# Patient Record
Sex: Male | Born: 1970 | Race: White | Hispanic: No | Marital: Married | Smoking: Current every day smoker
Health system: Southern US, Community
[De-identification: ages and names within clinical notes are randomized; demographics above are authoritative.]

## PROBLEM LIST (undated history)

## (undated) DIAGNOSIS — E785 Hyperlipidemia, unspecified: Secondary | ICD-10-CM

## (undated) DIAGNOSIS — I10 Essential (primary) hypertension: Secondary | ICD-10-CM

## (undated) HISTORY — PX: COLONOSCOPY: SHX174

## (undated) HISTORY — PX: TONSILLECTOMY: SUR1361

---

## 1999-06-02 ENCOUNTER — Emergency Department (HOSPITAL_COMMUNITY): Admission: EM | Admit: 1999-06-02 | Discharge: 1999-06-02 | Payer: Self-pay | Admitting: Emergency Medicine

## 2016-10-20 ENCOUNTER — Encounter (HOSPITAL_COMMUNITY): Payer: Self-pay | Admitting: Oncology

## 2016-10-20 ENCOUNTER — Emergency Department (HOSPITAL_COMMUNITY)
Admission: EM | Admit: 2016-10-20 | Discharge: 2016-10-20 | Disposition: A | Discharge status: Home / Self Care | Payer: BLUE CROSS/BLUE SHIELD | Attending: Emergency Medicine | Admitting: Emergency Medicine

## 2016-10-20 ENCOUNTER — Emergency Department (HOSPITAL_COMMUNITY): Payer: BLUE CROSS/BLUE SHIELD

## 2016-10-20 DIAGNOSIS — R079 Chest pain, unspecified: Secondary | ICD-10-CM

## 2016-10-20 DIAGNOSIS — I10 Essential (primary) hypertension: Secondary | ICD-10-CM | POA: Insufficient documentation

## 2016-10-20 DIAGNOSIS — F17210 Nicotine dependence, cigarettes, uncomplicated: Secondary | ICD-10-CM | POA: Insufficient documentation

## 2016-10-20 DIAGNOSIS — R0602 Shortness of breath: Secondary | ICD-10-CM | POA: Insufficient documentation

## 2016-10-20 HISTORY — DX: Hyperlipidemia, unspecified: E78.5

## 2016-10-20 HISTORY — DX: Essential (primary) hypertension: I10

## 2016-10-20 LAB — CBC WITH DIFFERENTIAL/PLATELET
BASOS ABS: 0 10*3/uL (ref 0.0–0.1)
BASOS PCT: 0 %
EOS ABS: 0.2 10*3/uL (ref 0.0–0.7)
Eosinophils Relative: 3 %
HEMATOCRIT: 42.2 % (ref 39.0–52.0)
Hemoglobin: 14 g/dL (ref 13.0–17.0)
Lymphocytes Relative: 36 %
Lymphs Abs: 2.9 10*3/uL (ref 0.7–4.0)
MCH: 30.5 pg (ref 26.0–34.0)
MCHC: 33.2 g/dL (ref 30.0–36.0)
MCV: 91.9 fL (ref 78.0–100.0)
MONO ABS: 0.7 10*3/uL (ref 0.1–1.0)
MONOS PCT: 9 %
NEUTROS ABS: 4.1 10*3/uL (ref 1.7–7.7)
Neutrophils Relative %: 52 %
PLATELETS: 165 10*3/uL (ref 150–400)
RBC: 4.59 MIL/uL (ref 4.22–5.81)
RDW: 12.1 % (ref 11.5–15.5)
WBC: 7.9 10*3/uL (ref 4.0–10.5)

## 2016-10-20 LAB — BASIC METABOLIC PANEL
ANION GAP: 8 (ref 5–15)
BUN: 11 mg/dL (ref 6–20)
CALCIUM: 9.3 mg/dL (ref 8.9–10.3)
CO2: 24 mmol/L (ref 22–32)
CREATININE: 0.71 mg/dL (ref 0.61–1.24)
Chloride: 103 mmol/L (ref 101–111)
Glucose, Bld: 111 mg/dL — ABNORMAL HIGH (ref 65–99)
Potassium: 4.2 mmol/L (ref 3.5–5.1)
Sodium: 135 mmol/L (ref 135–145)

## 2016-10-20 LAB — I-STAT TROPONIN, ED
TROPONIN I, POC: 0 ng/mL (ref 0.00–0.08)
Troponin i, poc: 0 ng/mL (ref 0.00–0.08)

## 2016-10-20 LAB — BRAIN NATRIURETIC PEPTIDE: B NATRIURETIC PEPTIDE 5: 10.8 pg/mL (ref 0.0–100.0)

## 2016-10-20 LAB — D-DIMER, QUANTITATIVE (NOT AT ARMC)

## 2016-10-20 MED ORDER — AMLODIPINE BESYLATE 5 MG PO TABS: 5.0000 mg | ORAL_TABLET | Freq: Once | ORAL | Status: DC

## 2016-10-20 MED ORDER — AMLODIPINE BESYLATE 2.5 MG PO TABS: 2.5000 mg | ORAL_TABLET | Freq: Every day | ORAL | 3 refills | Status: AC

## 2016-10-20 NOTE — ED Notes (Signed)
Patient transported to X-ray 

## 2016-10-20 NOTE — ED Notes (Signed)
Water given as requested

## 2016-10-20 NOTE — ED Provider Notes (Signed)
[facility]EMERGENCY DEPT Provider Note   CSN: [id number] Arrival date & time: 10/20/16  0024     History   Chief Complaint Chief Complaint  Patient presents with  . Shortness of Breath    HPI Billy Stephens is a 46 y.o. male.  Patient presents to the emergency department for evaluation of chest pain and shortness of breath.he had an episode of pain that felt like an electric shock in his chest and then became short of breath, dizzy and felt like he was going to pass out. He thinks he then started to have some anxiety and maybe a panic attack. ymptoms have significantly improved, however. Patient reports that he did check his blood pressure at home and it was very elevated. He has a history of elevated blood pressure, previously on lisinopril. He had side effects from the lisinopril, his doctor stopped it because his blood pressure was "borderline". At arrival to the ER, patient is improved.      Past Medical History:  Diagnosis Date  . Hyperlipidemia   . Hypertension     There are no active problems to display for this patient.   Past Surgical History:  Procedure Laterality Date  . COLONOSCOPY    . TONSILLECTOMY         Home Medications    Prior to Admission medications   Medication Sig Start Date End Date Taking? Authorizing Provider  omeprazole (PRILOSEC) 20 MG capsule Take 20 mg by mouth daily.   Yes [provider]  amLODipine (NORVASC) 2.5 MG tablet Take 1 tablet (2.5 mg total) by mouth daily. 10/20/16   Gilda Crease, MD    Family History No family history on file.  Social History Social History  Substance Use Topics  . Smoking status: Current Every Day Smoker    Packs/day: 0.50    Years: 25.00    Types: Cigarettes  . Smokeless tobacco: Never Used  . Alcohol use Yes     Comment: 8 beers per day     Allergies   Caffeine   Review of Systems Review of Systems  Respiratory: Positive for shortness of breath.   Cardiovascular:  Positive for chest pain.  All other systems reviewed and are negative.    Physical Exam Updated Vital Signs BP 136/84   Pulse 76   Temp 98.5 F (36.9 C) (Oral)   Resp 14   SpO2 96%   Physical Exam  Constitutional: He is oriented to person, place, and time. He appears well-developed and well-nourished. No distress.  HENT:  Head: Normocephalic and atraumatic.  Right Ear: Hearing normal.  Left Ear: Hearing normal.  Nose: Nose normal.  Mouth/Throat: Oropharynx is clear and moist and mucous membranes are normal.  Eyes: Pupils are equal, round, and reactive to light. Conjunctivae and EOM are normal.  Neck: Normal range of motion. Neck supple.  Cardiovascular: Regular rhythm, S1 normal and S2 normal.  Exam reveals no gallop and no friction rub.   No murmur heard. Pulmonary/Chest: Effort normal and breath sounds normal. No respiratory distress. He exhibits no tenderness.  Abdominal: Soft. Normal appearance and bowel sounds are normal. There is no hepatosplenomegaly. There is no tenderness. There is no rebound, no guarding, no tenderness at McBurney's point and negative Murphy's sign. No hernia.  Musculoskeletal: Normal range of motion.  Neurological: He is alert and oriented to person, place, and time. He has normal strength. No cranial nerve deficit or sensory deficit. Coordination normal. GCS eye subscore is 4. GCS verbal subscore is  5. GCS motor subscore is 6.  Skin: Skin is warm, dry and intact. No rash noted. No cyanosis.  Psychiatric: He has a normal mood and affect. His speech is normal and behavior is normal. Thought content normal.  Nursing note and vitals reviewed.    ED Treatments / Results  Labs (all labs ordered are listed, but only abnormal results are displayed) Labs Reviewed  BASIC METABOLIC PANEL - Abnormal; Notable for the following:       Result Value   Glucose, Bld 111 (*)    All other components within normal limits  CBC WITH DIFFERENTIAL/PLATELET  BRAIN  NATRIURETIC PEPTIDE  D-DIMER, QUANTITATIVE (NOT AT [facility])  I-STAT TROPONIN, ED  I-STAT TROPONIN, ED    EKG  EKG Interpretation  Date/Time:  Monday October 20 2016 00:32:49 EDT Ventricular Rate:  87 PR Interval:    QRS Duration: 84 QT Interval:  348 QTC Calculation: 419 R Axis:   75 Text Interpretation:  Sinus rhythm Probable left atrial enlargement Confirmed by Gilda CreasePollina, Happy J [phone] on 10/20/2016 12:42:04 AM       Radiology Dg Chest 2 View  Result Date: 10/20/2016 CLINICAL DATA:  Dyspnea for 2 hours.  Hypertension. EXAM: CHEST  2 VIEW COMPARISON:  None. FINDINGS: The lungs are clear. The pulmonary vasculature is normal. Heart size is normal. Hilar and mediastinal contours are unremarkable. There is no pleural effusion. IMPRESSION: No active cardiopulmonary disease. Electronically Signed   By: Ellery Plunkaniel R Mitchell M.D.   On: 10/20/2016 03:04    Procedures Procedures (including critical care time)  Medications Ordered in ED Medications  amLODipine (NORVASC) tablet 5 mg (5 mg Oral Not Given 10/20/16 0419)     Initial Impression / Assessment and Plan / ED Course  I have reviewed the triage vital signs and the nursing notes.  Pertinent labs & imaging results that were available during my care of the patient were reviewed by me and considered in my medical decision making (see chart for details).     Patient presents to the emergency department for evaluation of chest pain and shortness of breath. Patient had an episode earlier of sudden onset of shocklike pain in his chest followed by shortness of breath and some anxiety. He is much improved at the moment. His blood pressure was very high initially but has improved without intervention. I initially was going to administer amlodipine, but blood pressure came down to 146/88 without intervention.  EKG at arrival does not suggest ischemia or infarct. Troponin has been negative 2. He has a negative d-dimer. No evidence of  congestive heart failure, BNP normal, chest x-ray normal. Patient has been symptom free for a number of hours and has had a negative workup, is felt to be safe for discharge. Patient does not have a primary care physician at this time. Will be given a prescription for low-dose amlodipine. He will initiate this if he takes his blood pressure daily and it is staying high. He was counseled to find primary care for follow-up to further manage his blood pressure.  Final Clinical Impressions(s) / ED Diagnoses   Final diagnoses:  Chest pain, unspecified type  Essential hypertension    New Prescriptions New Prescriptions   AMLODIPINE (NORVASC) 2.5 MG TABLET    Take 1 tablet (2.5 mg total) by mouth daily.     Gilda CreasePollina, Guilherme J, MD 10/20/16 [phone]

## 2016-10-20 NOTE — ED Triage Notes (Signed)
Pt bib [facility] from home d/t central CP pt rated 1/10.  Pt reported to EMS that he had been drinking heavily yesterday and just sitting around the house.  Pt finished a rx for prednisone yesterday.  Per EMS upon arrival to pt's house, pt was diaphoretic, shob and pale.  Pt given 324 mg ASA PTA.  Pt denied radiation of CP.

## 2016-10-20 NOTE — ED Notes (Signed)
Pt reports having one episode of CP PTA that he described as a, "Shock."  Pt states, "It's like when you stuck your tongue on a 9 volt battery as a kid."  Pt states his main concern is the shob, dizziness and generally not feeling like himself that caused him to present to the hospital.

## 2017-05-13 ENCOUNTER — Encounter: Payer: Self-pay | Admitting: Gastroenterology

## 2017-05-18 ENCOUNTER — Other Ambulatory Visit: Payer: Self-pay

## 2017-05-18 ENCOUNTER — Ambulatory Visit: Payer: BLUE CROSS/BLUE SHIELD | Admitting: Gastroenterology

## 2018-07-30 ENCOUNTER — Encounter (HOSPITAL_COMMUNITY): Payer: Self-pay | Admitting: Emergency Medicine

## 2018-07-30 ENCOUNTER — Other Ambulatory Visit: Payer: Self-pay

## 2018-07-30 ENCOUNTER — Emergency Department (HOSPITAL_COMMUNITY)
Admission: EM | Admit: 2018-07-30 | Discharge: 2018-07-30 | Disposition: A | Discharge status: Home / Self Care | Payer: BLUE CROSS/BLUE SHIELD | Attending: Emergency Medicine | Admitting: Emergency Medicine

## 2018-07-30 DIAGNOSIS — T7840XA Allergy, unspecified, initial encounter: Secondary | ICD-10-CM | POA: Diagnosis not present

## 2018-07-30 DIAGNOSIS — I10 Essential (primary) hypertension: Secondary | ICD-10-CM | POA: Diagnosis not present

## 2018-07-30 DIAGNOSIS — F17210 Nicotine dependence, cigarettes, uncomplicated: Secondary | ICD-10-CM | POA: Diagnosis not present

## 2018-07-30 MED ORDER — EPINEPHRINE 0.3 MG/0.3ML IJ SOAJ: 0.3000 mg | INTRAMUSCULAR | 0 refills | Status: AC | PRN

## 2018-07-30 MED ORDER — PREDNISONE 20 MG PO TABS: 40.0000 mg | ORAL_TABLET | Freq: Every day | ORAL | 0 refills | Status: AC

## 2018-07-30 NOTE — ED Notes (Signed)
Bed: ID56 Expected date:  Expected time:  Means of arrival:  Comments: EMS 48 yo male allergic reaction bee sting/hives/swelling around site of sting

## 2018-07-30 NOTE — ED Triage Notes (Signed)
Patient BIB [facility] from home for bee sting at 1800 rt inner lower leg. Known allergy to bees. Within a few minutes pt noticed swelling, itching, and hives. Pt waited a little while before calling EMS due to fear of covid, but called when symptoms didn't improve. Pt hasn't taken any meds. Pt requested EMS not give Benadryl due to extreme drowsiness. Pt and EMS noted large firm,swollen area under left axillary, just below arm pit. Never noticed this before. LS clear, no difficulty breathing. No swelling of mouth, tongue or throat.

## 2018-07-31 NOTE — ED Provider Notes (Signed)
[facility] DEPT Provider Note   CSN: [id number] Arrival date & time: 07/30/18  1925     History   Chief Complaint Chief Complaint  Patient presents with  . Allergic Reaction    HPI Billy Stephens is a 48 y.o. male.     HPI   [name] with bee sting. Happened around 1800 today while mowing the lawn. Stung above R ankle. Went inside and then began having itching all over. Rash. Developed some painless swelling near L axilla and decided to seek evaluation. No respiratory complaints. No GI symptoms. No intervention prior to arrival.   Past Medical History:  Diagnosis Date  . Hyperlipidemia   . Hypertension     There are no active problems to display for this patient.   Past Surgical History:  Procedure Laterality Date  . COLONOSCOPY    . TONSILLECTOMY          Home Medications    Prior to Admission medications   Medication Sig Start Date End Date Taking? Authorizing Provider  amLODipine (NORVASC) 2.5 MG tablet Take 1 tablet (2.5 mg total) by mouth daily. Patient not taking: Reported on 07/30/2018 10/20/16   Orpah Greek, MD  EPINEPHrine (EPIPEN 2-PAK) 0.3 mg/0.3 mL IJ SOAJ injection Inject 0.3 mLs (0.3 mg total) into the muscle as needed for anaphylaxis. 07/30/18   Virgel Manifold, MD  predniSONE (DELTASONE) 20 MG tablet Take 2 tablets (40 mg total) by mouth daily. 07/30/18   Virgel Manifold, MD    Family History History reviewed. No pertinent family history.  Social History Social History   Tobacco Use  . Smoking status: Current Every Day Smoker    Packs/day: 0.50    Years: 25.00    Pack years: 12.50    Types: Cigarettes  . Smokeless tobacco: Never Used  Substance Use Topics  . Alcohol use: Yes    Comment: 8 beers per day  . Drug use: No     Allergies   Caffeine and Bee venom   Review of Systems Review of Systems  All systems reviewed and negative, other than as noted in HPI.  Physical Exam Updated Vital  Signs BP (!) 158/86 (BP Location: Right Arm)   Pulse (!) 101   Temp 99.4 F (37.4 C) (Oral)   Resp 18   Ht 6' (1.829 m)   Wt 111.1 kg   SpO2 98%   BMI 33.23 kg/m   Physical Exam Vitals signs and nursing note reviewed.  Constitutional:      General: He is not in acute distress.    Appearance: He is well-developed.  HENT:     Head: Normocephalic and atraumatic.     Mouth/Throat:     Mouth: Mucous membranes are moist.     Pharynx: Oropharynx is clear.     Comments: Oropharynx clear. Handling secretions. Normal sounding voice.  Eyes:     General:        Right eye: No discharge.        Left eye: No discharge.     Conjunctiva/sclera: Conjunctivae normal.  Neck:     Musculoskeletal: Neck supple.  Cardiovascular:     Rate and Rhythm: Normal rate and regular rhythm.     Heart sounds: Normal heart sounds. No murmur. No friction rub. No gallop.   Pulmonary:     Effort: Pulmonary effort is normal. No respiratory distress.     Breath sounds: Normal breath sounds.  Abdominal:     General: There is no  distension.     Palpations: Abdomen is soft.     Tenderness: There is no abdominal tenderness.  Musculoskeletal:        General: No tenderness.  Skin:    General: Skin is warm and dry.     Comments: Area to distal RLE consistent with recent insect bite/sting. Patch of erythema, increased warmth and induration. Painless swelling w/o overlying skin changes L axilla.   Neurological:     Mental Status: He is alert.  Psychiatric:        Behavior: Behavior normal.        Thought Content: Thought content normal.      ED Treatments / Results  Labs (all labs ordered are listed, but only abnormal results are displayed) Labs Reviewed - No data to display  EKG    Radiology No results found.  Procedures Procedures (including critical care time)  Medications Ordered in ED Medications - No data to display   Initial Impression / Assessment and Plan / ED Course  I have reviewed  the triage vital signs and the nursing notes.  Pertinent labs & imaging results that were available during my care of the patient were reviewed by me and considered in my medical decision making (see chart for details).     [name] with bee sting. Happened around 1800 today. Symptoms improving w/o intervention. No respiratory or GI complaints. No hypotension. Will discharge with prescriptions for epi pens. Discussed indications for usage. PRN benadryl recommended. Return precautions discussed.   Final Clinical Impressions(s) / ED Diagnoses   Final diagnoses:  Allergic reaction, initial encounter    ED Discharge Orders         Ordered    EPINEPHrine (EPIPEN 2-PAK) 0.3 mg/0.3 mL IJ SOAJ injection  As needed     07/30/18 2034    predniSONE (DELTASONE) 20 MG tablet  Daily     07/30/18 2043           Raeford RazorKohut, Kaija Kovacevic, MD 07/31/18 2004

## 2018-12-15 ENCOUNTER — Other Ambulatory Visit: Payer: Self-pay

## 2018-12-15 DIAGNOSIS — Z20822 Contact with and (suspected) exposure to covid-19: Secondary | ICD-10-CM

## 2018-12-16 LAB — NOVEL CORONAVIRUS, NAA: SARS-CoV-2, NAA: NOT DETECTED

## 2019-04-04 IMAGING — CR DG CHEST 2V
2 series · 2 of 2 positions shown · non-contrast
Comparison: None.

CLINICAL DATA: Dyspnea for 2 hours.  Hypertension.

EXAM:
CHEST  2 VIEW

[chest pa]
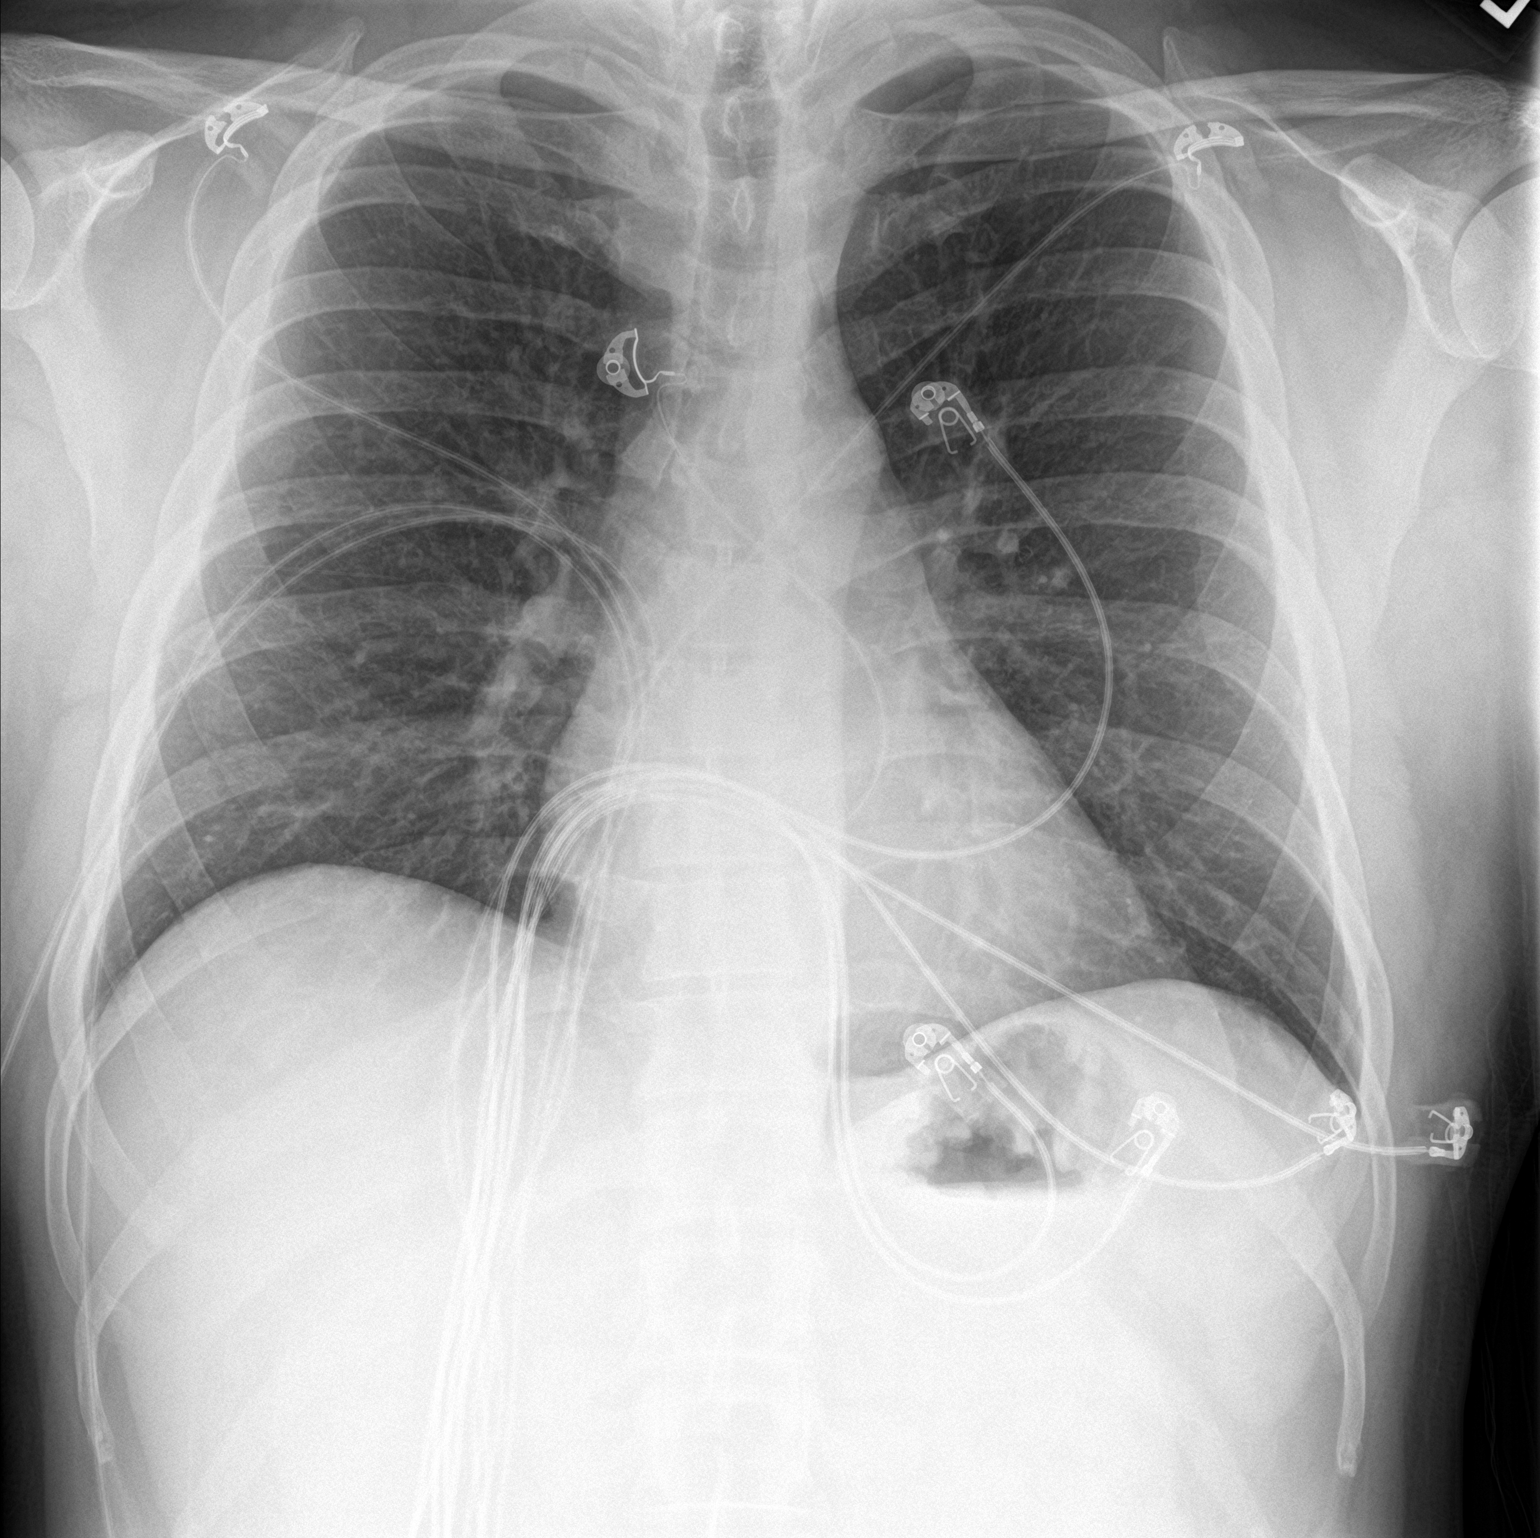

[chest lat]
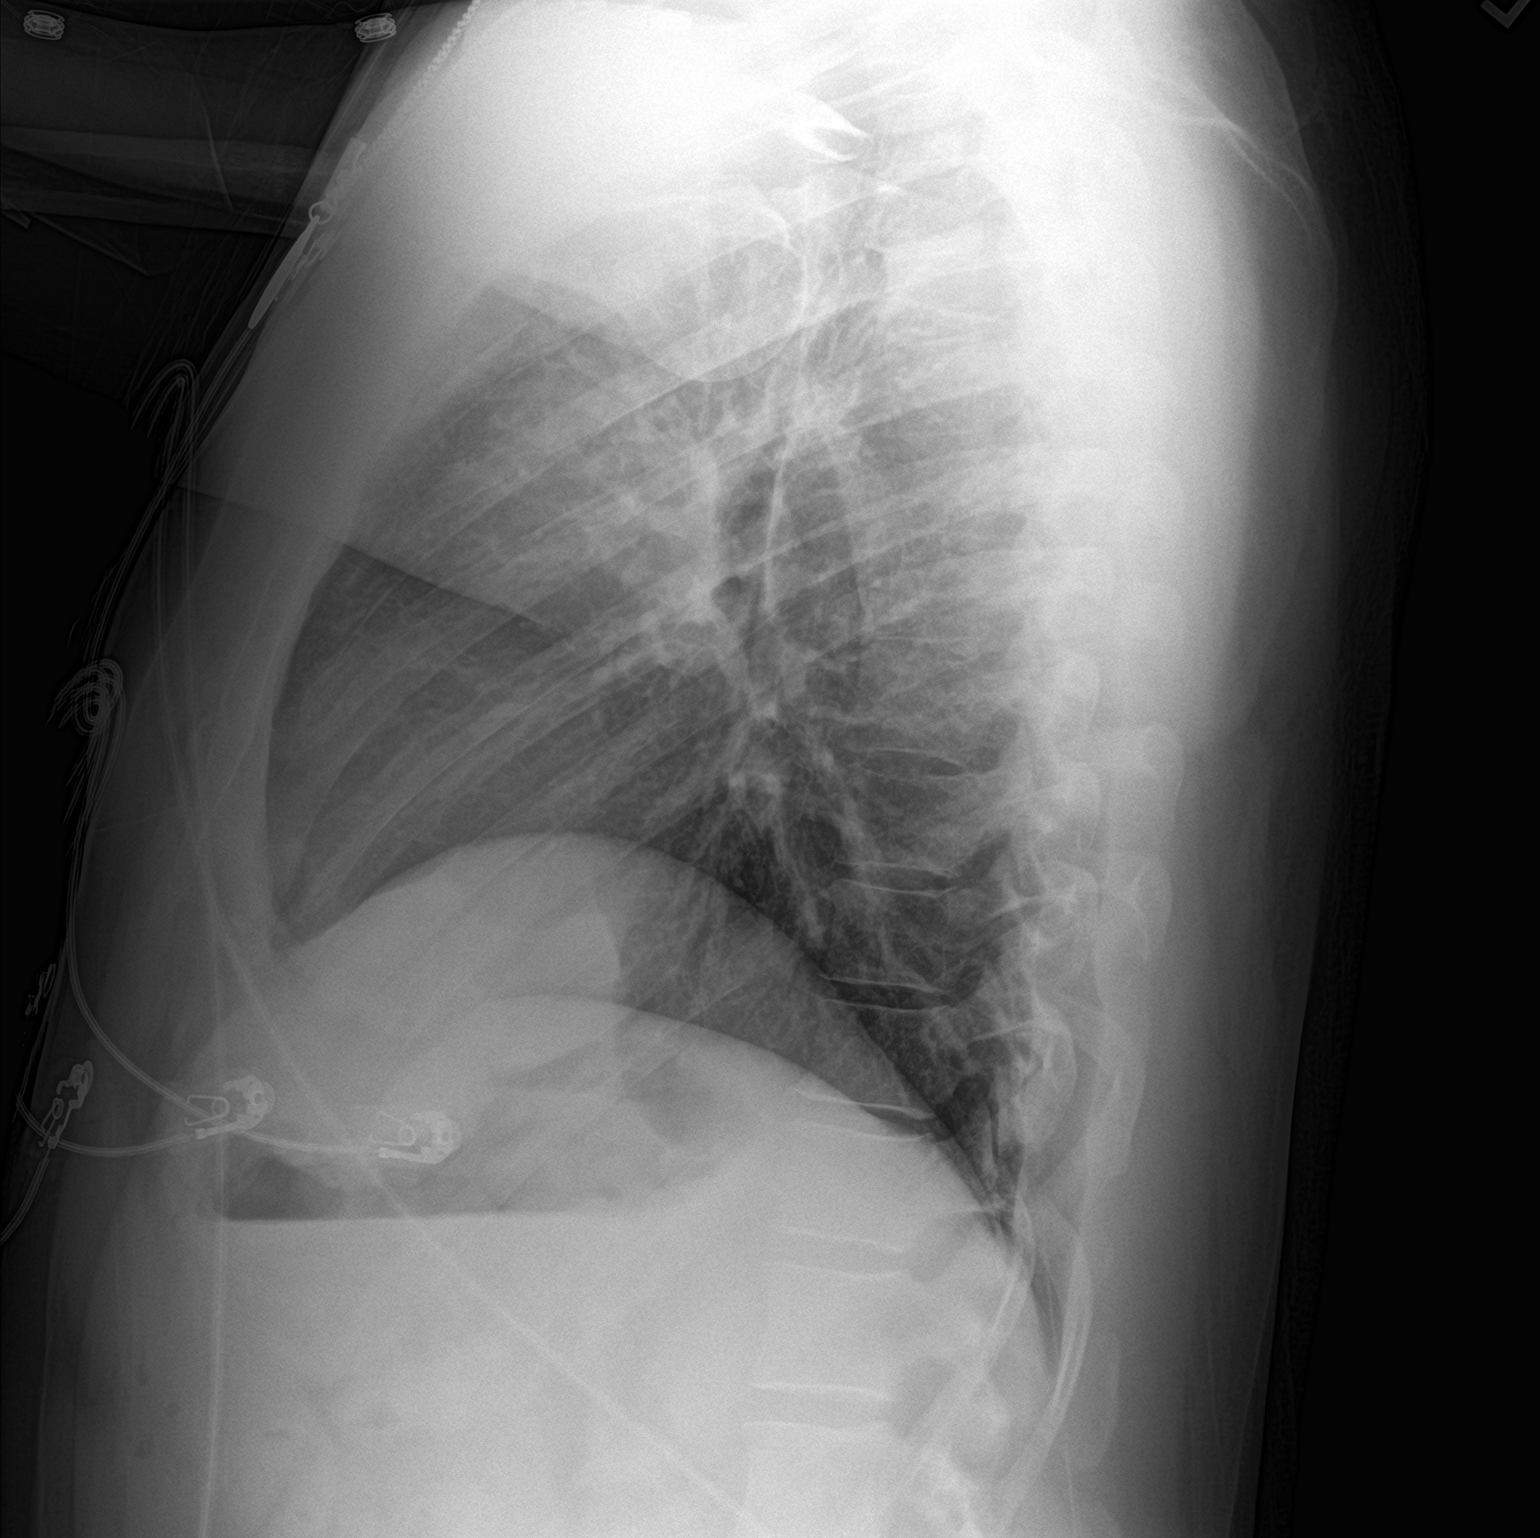

[2 of 2 positions shown; findings below may reference images not displayed]

FINDINGS: The lungs are clear. The pulmonary vasculature is normal. Heart size
is normal. Hilar and mediastinal contours are unremarkable. There is
no pleural effusion.
IMPRESSION: No active cardiopulmonary disease.

## 2019-04-23 ENCOUNTER — Ambulatory Visit: Payer: BLUE CROSS/BLUE SHIELD | Attending: Internal Medicine

## 2019-04-23 ENCOUNTER — Ambulatory Visit: Payer: BLUE CROSS/BLUE SHIELD

## 2019-04-23 DIAGNOSIS — Z23 Encounter for immunization: Secondary | ICD-10-CM | POA: Insufficient documentation

## 2019-04-23 NOTE — Progress Notes (Signed)
   Covid-19 Vaccination Clinic  Name:  Billy Stephens    MRN: [id number] DOB: 16-Dec-1970  04/23/2019  [name] was observed post Covid-19 immunization for 15 minutes without incident. He was provided with Vaccine Information Sheet and instruction to access the V-Safe system.   Mr. Due was instructed to call 911 with any severe reactions post vaccine: Marland Kitchen Difficulty breathing  . Swelling of face and throat  . A fast heartbeat  . A bad rash all over body  . Dizziness and weakness   Immunizations Administered    Name Date Dose VIS Date Route   Pfizer COVID-19 Vaccine 04/23/2019 12:28 PM 0.3 mL 01/28/2019 Intramuscular   Manufacturer: [employer], [employer]   Lot: [id number]   NDC: [address]-5

## 2019-05-17 ENCOUNTER — Ambulatory Visit: Payer: BLUE CROSS/BLUE SHIELD | Attending: Internal Medicine

## 2019-05-17 DIAGNOSIS — Z23 Encounter for immunization: Secondary | ICD-10-CM

## 2019-05-17 NOTE — Progress Notes (Signed)
   Covid-19 Vaccination Clinic  Name:  Billy Stephens    MRN: [id number] DOB: Jan 29, 1971  05/17/2019  Billy Stephens was observed post Covid-19 immunization for 15 minutes without incident. He was provided with Vaccine Information Sheet and instruction to access the V-Safe system.   Billy Stephens was instructed to call 911 with any severe reactions post vaccine: Marland Kitchen Difficulty breathing  . Swelling of face and throat  . A fast heartbeat  . A bad rash all over body  . Dizziness and weakness   Immunizations Administered    Name Date Dose VIS Date Route   Pfizer COVID-19 Vaccine 05/17/2019  2:41 PM 0.3 mL 01/28/2019 Intramuscular   Manufacturer: [employer], [employer]   Lot: [id number]   NDC: [address]-0
